# Patient Record
Sex: Male | Born: 1985 | Race: White | Hispanic: No | Marital: Single | Smoking: Never smoker
Health system: Southern US, Community
[De-identification: ages and names within clinical notes are randomized; demographics above are authoritative.]

## PROBLEM LIST (undated history)

## (undated) DIAGNOSIS — J45909 Unspecified asthma, uncomplicated: Secondary | ICD-10-CM

## (undated) HISTORY — PX: NO PAST SURGERIES: SHX2092

---
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
## 2005-01-05 ENCOUNTER — Emergency Department: Payer: Self-pay | Admitting: Emergency Medicine

## 2005-03-21 ENCOUNTER — Emergency Department: Payer: Self-pay | Admitting: Emergency Medicine

## 2006-05-18 ENCOUNTER — Emergency Department: Payer: Self-pay | Admitting: Emergency Medicine

## 2006-05-19 ENCOUNTER — Other Ambulatory Visit: Payer: Self-pay

## 2006-07-30 IMAGING — CR DG RIBS 2V*R*
1 series · 3 of 3 positions shown · non-contrast
Comparison: none

REASON FOR EXAM: RIGHT-sided pain. [HOSPITAL]
COMMENTS:

PROCEDURE:     DXR - DXR RIBS RIGHT UNILATERAL  - March 21, 2005  [DATE]
RESULT:     Three views of the RIGHT ribs show no fracture, dislocation, or
other acute bony abnormality.

[Series 1: view not recorded · 0.17mm/px · 3 of 3 slices shown]
[im 1/3]
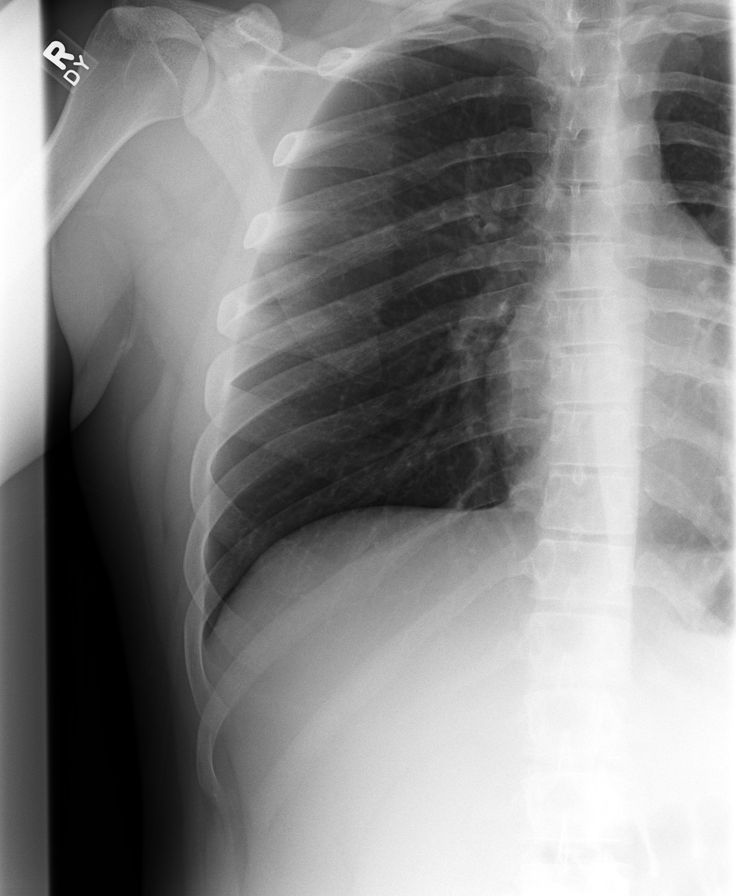
[im 2/3]
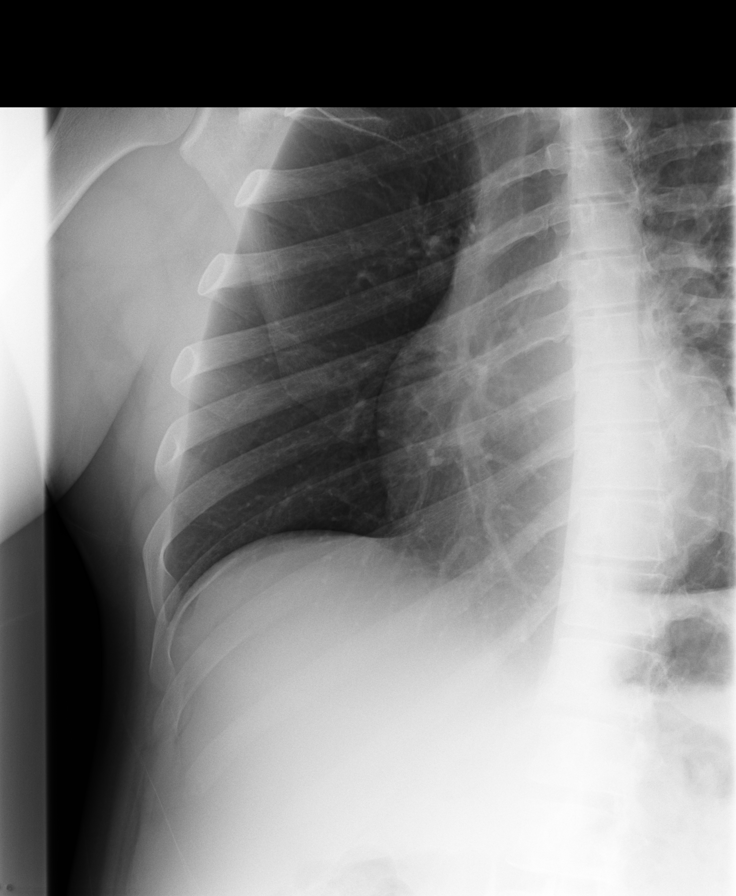
[im 3/3]
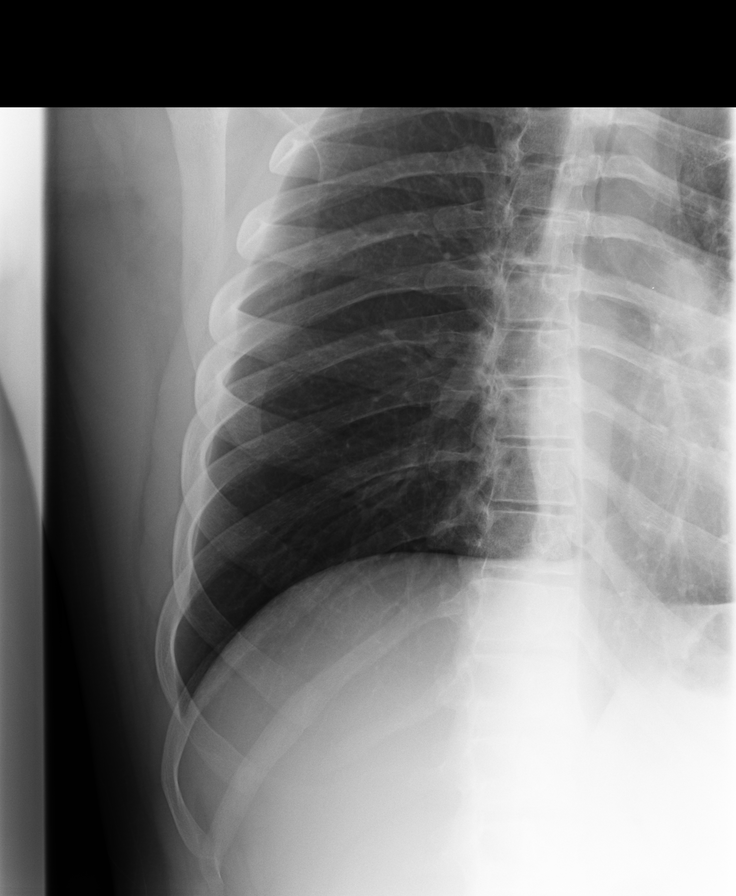

[3 of 3 positions shown; findings below may reference images not displayed]

IMPRESSION: No significant abnormalities are noted.

## 2007-09-16 ENCOUNTER — Emergency Department: Payer: Self-pay | Admitting: Emergency Medicine

## 2010-07-07 ENCOUNTER — Emergency Department: Payer: Self-pay | Admitting: Emergency Medicine

## 2010-07-09 ENCOUNTER — Emergency Department: Payer: Self-pay | Admitting: Internal Medicine

## 2014-02-06 ENCOUNTER — Emergency Department: Payer: Self-pay | Admitting: Emergency Medicine

## 2014-02-06 LAB — COMPREHENSIVE METABOLIC PANEL
Albumin: 4.7 g/dL (ref 3.4–5.0)
Alkaline Phosphatase: 127 U/L — ABNORMAL HIGH
Anion Gap: 15 (ref 7–16)
BUN: 16 mg/dL (ref 7–18)
Bilirubin,Total: 0.7 mg/dL (ref 0.2–1.0)
Calcium, Total: 9.4 mg/dL (ref 8.5–10.1)
Chloride: 106 mmol/L (ref 98–107)
Co2: 20 mmol/L — ABNORMAL LOW (ref 21–32)
Creatinine: 1.55 mg/dL — ABNORMAL HIGH (ref 0.60–1.30)
EGFR (African American): >60
EGFR (Non-African Amer.): >60
Glucose: 106 mg/dL — ABNORMAL HIGH (ref 65–99)
Osmolality: 283 (ref 275–301)
Potassium: 3.8 mmol/L (ref 3.5–5.1)
SGOT(AST): 35 U/L (ref 15–37)
SGPT (ALT): 23 U/L
Sodium: 141 mmol/L (ref 136–145)
Total Protein: 8.4 g/dL — ABNORMAL HIGH (ref 6.4–8.2)

## 2014-02-06 LAB — URINALYSIS, COMPLETE
Bilirubin,UR: NEGATIVE
Glucose,UR: NEGATIVE mg/dL (ref 0–75)
Leukocyte Esterase: NEGATIVE
Nitrite: NEGATIVE
Ph: 5 (ref 4.5–8.0)
Protein: 100
RBC,UR: 26 /HPF (ref 0–5)
Specific Gravity: 1.034 (ref 1.003–1.030)
Squamous Epithelial: NONE SEEN
WBC UR: NONE SEEN /HPF (ref 0–5)

## 2014-02-06 LAB — CBC WITH DIFFERENTIAL/PLATELET
Basophil #: 0.1 10*3/uL (ref 0.0–0.1)
Basophil %: 0.4 %
Eosinophil #: 0.1 10*3/uL (ref 0.0–0.7)
Eosinophil %: 0.5 %
HCT: 48.6 % (ref 40.0–52.0)
HGB: 16.5 g/dL (ref 13.0–18.0)
Lymphocyte #: 4.4 10*3/uL — ABNORMAL HIGH (ref 1.0–3.6)
Lymphocyte %: 18.4 %
MCH: 32.3 pg (ref 26.0–34.0)
MCHC: 34 g/dL (ref 32.0–36.0)
MCV: 95 fL (ref 80–100)
Monocyte #: 2.5 x10 3/mm — ABNORMAL HIGH (ref 0.2–1.0)
Monocyte %: 10.5 %
Neutrophil #: 16.9 10*3/uL — ABNORMAL HIGH (ref 1.4–6.5)
Neutrophil %: 70.2 %
Platelet: 314 10*3/uL (ref 150–440)
RBC: 5.11 10*6/uL (ref 4.40–5.90)
RDW: 12.7 % (ref 11.5–14.5)
WBC: 24.1 10*3/uL — ABNORMAL HIGH (ref 3.8–10.6)

## 2014-02-06 LAB — LIPASE, BLOOD: Lipase: 147 U/L (ref 73–393)

## 2015-01-31 ENCOUNTER — Encounter: Payer: Self-pay | Admitting: Emergency Medicine

## 2015-01-31 ENCOUNTER — Emergency Department
Admission: EM | Admit: 2015-01-31 | Discharge: 2015-01-31 | Disposition: A | Discharge status: Home / Self Care | Payer: Self-pay | Attending: Emergency Medicine | Admitting: Emergency Medicine

## 2015-01-31 DIAGNOSIS — R42 Dizziness and giddiness: Secondary | ICD-10-CM | POA: Insufficient documentation

## 2015-01-31 HISTORY — DX: Unspecified asthma, uncomplicated: J45.909

## 2015-01-31 MED ORDER — HYDROCHLOROTHIAZIDE 25 MG PO TABS: 25.0000 mg | ORAL_TABLET | Freq: Every day | ORAL | Status: AC

## 2015-01-31 MED ORDER — HYDROCHLOROTHIAZIDE 12.5 MG PO TABS: 25.0000 mg | ORAL_TABLET | Freq: Every day | ORAL | Status: DC

## 2015-01-31 NOTE — ED Notes (Signed)
Dizziness and room spinning for several days   Denies any specific pain but just feels full

## 2015-01-31 NOTE — Discharge Instructions (Signed)
Benign Positional Vertigo Vertigo means you feel like you or your surroundings are moving when they are not. Benign positional vertigo is the most common form of vertigo. Benign means that the cause of your condition is not serious. Benign positional vertigo is more common in older adults. CAUSES  Benign positional vertigo is the result of an upset in the labyrinth system. This is an area in the middle ear that helps control your balance. This may be caused by a viral infection, head injury, or repetitive motion. However, often no specific cause is found. SYMPTOMS  Symptoms of benign positional vertigo occur when you move your head or eyes in different directions. Some of the symptoms may include:  Loss of balance and falls.  Vomiting.  Blurred vision.  Dizziness.  Nausea.  Involuntary eye movements (nystagmus). DIAGNOSIS  Benign positional vertigo is usually diagnosed by physical exam. If the specific cause of your benign positional vertigo is unknown, your caregiver may perform imaging tests, such as magnetic resonance imaging (MRI) or computed tomography (CT). TREATMENT  Your caregiver may recommend movements or procedures to correct the benign positional vertigo. Medicines such as meclizine, benzodiazepines, and medicines for nausea may be used to treat your symptoms. In rare cases, if your symptoms are caused by certain conditions that affect the inner ear, you may need surgery. HOME CARE INSTRUCTIONS   Follow your caregiver's instructions.  Move slowly. Do not make sudden body or head movements.  Avoid driving.  Avoid operating heavy machinery.  Avoid performing any tasks that would be dangerous to you or others during a vertigo episode.  Drink enough fluids to keep your urine clear or pale yellow. SEEK IMMEDIATE MEDICAL CARE IF:   You develop problems with walking, weakness, numbness, or using your arms, hands, or legs.  You have difficulty speaking.  You develop  severe headaches.  Your nausea or vomiting continues or gets worse.  You develop visual changes.  Your family or friends notice any behavioral changes.  Your condition gets worse.  You have a fever.  You develop a stiff neck or sensitivity to light. MAKE SURE YOU:   Understand these instructions.  Will watch your condition.  Will get help right away if you are not doing well or get worse. Document Released: 03/05/2006 Document Revised: 08/20/2011 Document Reviewed: 02/15/2011 ExitCare Patient Information 2015 [employer]. This information is not intended to replace advice given to you by your health care provider. Make sure you discuss any questions you have with your health care provider.    

## 2015-01-31 NOTE — ED Provider Notes (Signed)
[facility] Emergency Department Provider Note  ____________________________________________  Time seen: On arrival  I have reviewed the triage vital signs and the nursing notes.   HISTORY  Chief Complaint Dizziness    HPI Henry Mosley. is a 29 y.o. male who complains of a sensation of room spinning. Started approximately one week ago when he was at the beach and has been improving since then. He denies head injury he denies fevers chills or neck pain. He did have a CT scan done at [facility] in [place] which was normal. He denies change in hearing. No other neuro deficits. No change in eyesight     Past Medical History  Diagnosis Date  . Asthma     There are no active problems to display for this patient.   History reviewed. No pertinent past surgical history.  Current Outpatient Rx  Name  Route  Sig  Dispense  Refill  . hydrochlorothiazide (HYDRODIURIL) 25 MG tablet   Oral   Take 1 tablet (25 mg total) by mouth daily.   30 tablet   0     Allergies Review of patient's allergies indicates no known allergies.  No family history on file.  Social History Social History  Substance Use Topics  . Smoking status: Never Smoker   . Smokeless tobacco: None  . Alcohol Use: No    Review of Systems  Constitutional: Negative for fever. Eyes: Negative for visual changes. ENT: Negative for sore throat Cardiovascular: Negative for chest pain. Respiratory: Negative for shortness of breath. Gastrointestinal: Negative for abdominal pain, vomiting and diarrhea. Genitourinary: Negative for dysuria. Musculoskeletal: Negative for back pain. Skin: Negative for rash. Neurological: Negative for headaches or focal weakness. Positive for dizziness/vertigo     ____________________________________________   PHYSICAL EXAM:  VITAL SIGNS: BP 165/80, HR 88  ED Triage Vitals  Enc Vitals Group     BP --      Pulse --      Resp --      Temp --       Temp src --      SpO2 --      Weight 01/31/15 1351 200 lb (90.719 kg)     Height 01/31/15 1351  (1.778 m)     Head Cir --      Peak Flow --      Pain Score 01/31/15 1351 5     Pain Loc --      Pain Edu? --      Excl. in GC? --      Constitutional: Alert and oriented. Well appearing and in no distress. Eyes: Conjunctivae are normal. PERRLA, EOMI ENT   Head: Normocephalic and atraumatic.   Mouth/Throat: Mucous membranes are moist.  Ears: Normal tympanic membrane bilaterally, normal canal Cardiovascular: Normal rate, regular rhythm. Normal and symmetric distal pulses are present in all extremities. No murmurs, rubs, or gallops. Respiratory: Normal respiratory effort without tachypnea nor retractions. Breath sounds are clear and equal bilaterally.  Gastrointestinal: Soft and non-tender in all quadrants. No distention. There is no CVA tenderness. Genitourinary: deferred Musculoskeletal: Nontender with normal range of motion in all extremities. No lower extremity tenderness nor edema. Neurologic:  Normal speech and language. No gross focal neurologic deficits are appreciated. Cranial nerves II through XII are intact Skin:  Skin is warm, dry and intact. No rash noted. Psychiatric: Mood and affect are normal. Patient exhibits appropriate insight and judgment.  ____________________________________________    LABS (pertinent positives/negatives)  Labs Reviewed -  No data to display  ____________________________________________   EKG  None  ____________________________________________    RADIOLOGY I have personally reviewed any xrays that were ordered on this patient: None  ____________________________________________   PROCEDURES  Procedure(s) performed: none  Critical Care performed: none  ____________________________________________   INITIAL IMPRESSION / ASSESSMENT AND PLAN / ED COURSE  Pertinent labs & imaging results that were available during  my care of the patient were reviewed by me and considered in my medical decision making (see chart for details).  Patient well-appearing and in no distress. He is neurologically intact. His symptoms are consistent with vertigo. I recommend meclizine 3 times a day and I think he will need follow-up with ENT for possible tilt table. Return precautions discussed with patient  ____________________________________________   FINAL CLINICAL IMPRESSION(S) / ED DIAGNOSES  Final diagnoses:  Vertigo     Jene Every, MD 01/31/15 [phone]

## 2015-01-31 NOTE — ED Notes (Signed)
Pt went to hospital in [place] last week, head CT was normal per pt.

## 2015-01-31 NOTE — ED Notes (Signed)
Pt states he was on vacation at the beach last week when his dizziness "room spinning" started. States his head feels "full."

## 2015-01-31 NOTE — ED Notes (Signed)
Pt discharged home after verbalizing understanding of discharge instructions; nad noted. 

## 2016-08-23 ENCOUNTER — Encounter: Payer: Self-pay | Admitting: Emergency Medicine

## 2016-08-23 ENCOUNTER — Emergency Department: Payer: Self-pay

## 2016-08-23 ENCOUNTER — Emergency Department
Admission: EM | Admit: 2016-08-23 | Discharge: 2016-08-23 | Disposition: A | Discharge status: Home / Self Care | Payer: Self-pay | Attending: Emergency Medicine | Admitting: Emergency Medicine

## 2016-08-23 ENCOUNTER — Other Ambulatory Visit: Payer: Self-pay

## 2016-08-23 DIAGNOSIS — J189 Pneumonia, unspecified organism: Secondary | ICD-10-CM

## 2016-08-23 DIAGNOSIS — J181 Lobar pneumonia, unspecified organism: Secondary | ICD-10-CM | POA: Insufficient documentation

## 2016-08-23 DIAGNOSIS — J45909 Unspecified asthma, uncomplicated: Secondary | ICD-10-CM | POA: Insufficient documentation

## 2016-08-23 LAB — CBC
HCT: 41.1 % (ref 40.0–52.0)
HEMOGLOBIN: 14.1 g/dL (ref 13.0–18.0)
MCH: 32 pg (ref 26.0–34.0)
MCHC: 34.4 g/dL (ref 32.0–36.0)
MCV: 93.1 fL (ref 80.0–100.0)
Platelets: 364 10*3/uL (ref 150–440)
RBC: 4.41 MIL/uL (ref 4.40–5.90)
RDW: 12.8 % (ref 11.5–14.5)
WBC: 23 10*3/uL — ABNORMAL HIGH (ref 3.8–10.6)

## 2016-08-23 LAB — BASIC METABOLIC PANEL
ANION GAP: 7 (ref 5–15)
BUN: 11 mg/dL (ref 6–20)
CHLORIDE: 107 mmol/L (ref 101–111)
CO2: 26 mmol/L (ref 22–32)
Calcium: 8.9 mg/dL (ref 8.9–10.3)
Creatinine, Ser: 0.83 mg/dL (ref 0.61–1.24)
GFR calc Af Amer: >60 mL/min (ref >60)
GFR calc non Af Amer: >60 mL/min (ref >60)
GLUCOSE: 102 mg/dL — AB (ref 65–99)
POTASSIUM: 3.2 mmol/L — AB (ref 3.5–5.1)
Sodium: 140 mmol/L (ref 135–145)

## 2016-08-23 LAB — TROPONIN I

## 2016-08-23 MED ORDER — NAPROXEN 500 MG PO TABS: 500.0000 mg | ORAL_TABLET | Freq: Two times a day (BID) | ORAL | 0 refills | Status: AC

## 2016-08-23 MED ORDER — AZITHROMYCIN 250 MG PO TABS: ORAL_TABLET | ORAL | 0 refills | Status: AC

## 2016-08-23 MED ORDER — CEFTRIAXONE SODIUM-DEXTROSE 1-3.74 GM-% IV SOLR
INTRAVENOUS | Status: AC
  Administered 2016-08-23: 1000 mg
  Filled 2016-08-23: qty 50

## 2016-08-23 MED ORDER — DEXTROSE 5 % IV SOLN
1.0000 g | Freq: Once | INTRAVENOUS | Status: AC
  Administered 2016-08-23: 1 g via INTRAVENOUS
  Filled 2016-08-23: qty 10

## 2016-08-23 MED ORDER — AZITHROMYCIN 500 MG PO TABS
500.0000 mg | ORAL_TABLET | Freq: Once | ORAL | Status: AC
  Administered 2016-08-23: 500 mg via ORAL
  Filled 2016-08-23: qty 1

## 2016-08-23 MED ORDER — SODIUM CHLORIDE 0.9 % IV BOLUS (SEPSIS)
1000.0000 mL | Freq: Once | INTRAVENOUS | Status: AC
  Administered 2016-08-23: 1000 mL via INTRAVENOUS

## 2016-08-23 NOTE — ED Triage Notes (Signed)
Says has been sick with resp illness since Sunday.  Says last night about 2 am started having chest pain mid chest after he coughed.  That pain is gone now, but he did have some tingling in arms earlier and called ems.

## 2016-08-23 NOTE — ED Provider Notes (Signed)
[facility] Emergency Department Provider Note  ____________________________________________  Time seen: Approximately 1:12 PM  I have reviewed the triage vital signs and the nursing notes.   HISTORY  Chief Complaint Chest Pain    HPI Henry Spelled G Laye Jr. is a 31 y.o. male who complains of gradual onset shortness of breath and occasional sharp pleuritic chest pain on the left side over the past 5 days. The pain is intermittent. He's had some productive cough as well. Pain is currently resolved. Nonradiating, moderate intensity when present. Worsened by deep breath or forceful coughing. He reports having a fever and chills and it feels just like when he had pneumonia as a child.  Denies any recent travel, hospitalizations or surgeries. No history of DVT or PE.     Past Medical History:  Diagnosis Date  . Asthma      There are no active problems to display for this patient.    History reviewed. No pertinent surgical history.   Prior to Admission medications   Medication Sig Start Date End Date Taking? Authorizing Provider  azithromycin (ZITHROMAX Z-PAK) 250 MG tablet Take 2 tablets (500 mg) on  Day 1,  followed by 1 tablet (250 mg) once daily on Days 2 through 5. 08/23/16   Sharman CheekPhillip Liev Brockbank, MD  hydrochlorothiazide (HYDRODIURIL) 25 MG tablet Take 1 tablet (25 mg total) by mouth daily. Patient not taking: Reported on 08/23/2016 01/31/15   Jene Everyobert Kinner, MD  naproxen (NAPROSYN) 500 MG tablet Take 1 tablet (500 mg total) by mouth 2 (two) times daily with a meal. 08/23/16   Sharman CheekPhillip Annarose Ouellet, MD     Allergies Patient has no known allergies.   No family history on file.  Social History Social History  Substance Use Topics  . Smoking status: Never Smoker  . Smokeless tobacco: Never Used  . Alcohol use No    Review of Systems  Constitutional:   Positive fever and chills.  ENT:   No sore throat. No rhinorrhea. Cardiovascular:   That of intermittent  pleuritic chest pain. Respiratory:   Positive shortness of breath with productive cough. Gastrointestinal:   Negative for abdominal pain, vomiting and diarrhea.  Genitourinary:   Negative for dysuria or difficulty urinating. Musculoskeletal:   Negative for focal pain or swelling Neurological:   Negative for headaches 10-point ROS otherwise negative.  ____________________________________________   PHYSICAL EXAM:  VITAL SIGNS: ED Triage Vitals [08/23/16 1117]  Enc Vitals Group     BP (!) 162/98     Pulse Rate (!) 102     Resp 16     Temp 99.2 F (37.3 C)     Temp Source Oral     SpO2 99 %     Weight 200 lb (90.7 kg)     Height 5\' 10"  (1.778 m)     Head Circumference      Peak Flow      Pain Score      Pain Loc      Pain Edu?      Excl. in GC?     Vital signs reviewed, nursing assessments reviewed.   Constitutional:   Alert and oriented. Well appearing and in no distress. Eyes:   No scleral icterus. No conjunctival pallor. PERRL. EOMI.  No nystagmus. ENT   Head:   Normocephalic and atraumatic.   Nose:   No congestion/rhinnorhea. No septal hematoma   Mouth/Throat:   MMM, no pharyngeal erythema. No peritonsillar mass.    Neck:   No stridor.  No SubQ emphysema. No meningismus. Hematological/Lymphatic/Immunilogical:   No cervical lymphadenopathy. Cardiovascular:   Tachycardia heart rate 105. Symmetric bilateral radial and DP pulses.  No murmurs.  Respiratory:   Normal respiratory effort without tachypnea nor retractions. Breath sounds are clear and equal bilaterally. No wheezes/rales/rhonchi. Gastrointestinal:   Soft and nontender. Non distended. There is no CVA tenderness.  No rebound, rigidity, or guarding. Genitourinary:   deferred Musculoskeletal:   Normal range of motion in all extremities. No joint effusions.  No lower extremity tenderness.  No edema. Neurologic:   Normal speech and language.  CN 2-10 normal. Motor grossly intact. No gross focal  neurologic deficits are appreciated.  Skin:    Skin is warm, dry and intact. No rash noted.  No petechiae, purpura, or bullae.  ____________________________________________    LABS (pertinent positives/negatives) (all labs ordered are listed, but only abnormal results are displayed) Labs Reviewed  BASIC METABOLIC PANEL - Abnormal; Notable for the following:       Result Value   Potassium 3.2 (*)    Glucose, Bld 102 (*)    All other components within normal limits  CBC - Abnormal; Notable for the following:    WBC 23.0 (*)    All other components within normal limits  TROPONIN I   ____________________________________________   EKG  Interpreted by me  Date: 08/23/2016  Rate: 98  Rhythm: normal sinus rhythm  QRS Axis: normal  Intervals: normal  ST/T Wave abnormalities: normal  Conduction Disutrbances: none  Narrative Interpretation: unremarkable      ____________________________________________    RADIOLOGY  Dg Chest 2 View  Result Date: 08/23/2016 CLINICAL DATA:  Or days of respiratory illness in with chest pain and cough. History of childhood asthma. Nonsmoker. EXAM: CHEST  2 VIEW COMPARISON:  Chest x-ray of May 19, 2006. FINDINGS: There is dense infiltrate in the superior segment of the left lower lobe. The right lung is clear. There is no pleural effusion or pneumothorax. The heart and pulmonary vascularity are normal. The bony thorax is unremarkable. IMPRESSION: Dense infiltrate compatible with pneumonia in the superior segment of the left lower lobe. Follow-up radiographs following anticipated antibiotic therapy are recommended unless the patient's symptoms completely resolve. Electronically Signed   By: David  [place] M.D.   On: 08/23/2016 11:38  Image daily me. The area of consolidation is not consistent with Hampton's hump.  ____________________________________________   PROCEDURES Procedures  ____________________________________________   INITIAL  IMPRESSION / ASSESSMENT AND PLAN / ED COURSE  Pertinent labs & imaging results that were available during my care of the patient were reviewed by me and considered in my medical decision making (see chart for details).  Patient well appearing no acute distress, presents with pleuritic chest pain shortness of breath and productive cough. Also history of fever. Examination reveals tachycardia but otherwise unremarkable. Workup includes leukocytosis and consolidation of partial left lower lobe on x-ray consistent with pneumonia. Presentation is not consistent with ACS or PE or dissection. We'll start the patient on IV fluids and antibiotics here in the ED to ensure hydration, prescribe antibiotics and follow up with primary care. Return precautions given.         ____________________________________________   FINAL CLINICAL IMPRESSION(S) / ED DIAGNOSES  Final diagnoses:  Community acquired pneumonia of left lower lobe of lung (HCC)      New Prescriptions   AZITHROMYCIN (ZITHROMAX Z-PAK) 250 MG TABLET    Take 2 tablets (500 mg) on  Day 1,  followed by 1 tablet (250  mg) once daily on Days 2 through 5.   NAPROXEN (NAPROSYN) 500 MG TABLET    Take 1 tablet (500 mg total) by mouth 2 (two) times daily with a meal.     Portions of this note were generated with dragon dictation software. Dictation errors may occur despite best attempts at proofreading.    Sharman Cheek, MD 08/23/16 1318

## 2016-12-18 ENCOUNTER — Encounter: Payer: Self-pay | Admitting: Emergency Medicine

## 2016-12-18 ENCOUNTER — Emergency Department
Admission: EM | Admit: 2016-12-18 | Discharge: 2016-12-18 | Disposition: A | Discharge status: Home / Self Care | Payer: Self-pay | Attending: Student in an Organized Health Care Education/Training Program | Admitting: Student in an Organized Health Care Education/Training Program

## 2016-12-18 DIAGNOSIS — Y939 Activity, unspecified: Secondary | ICD-10-CM | POA: Insufficient documentation

## 2016-12-18 DIAGNOSIS — Z79899 Other long term (current) drug therapy: Secondary | ICD-10-CM | POA: Insufficient documentation

## 2016-12-18 DIAGNOSIS — Y929 Unspecified place or not applicable: Secondary | ICD-10-CM | POA: Insufficient documentation

## 2016-12-18 DIAGNOSIS — Y999 Unspecified external cause status: Secondary | ICD-10-CM | POA: Insufficient documentation

## 2016-12-18 DIAGNOSIS — W450XXA Nail entering through skin, initial encounter: Secondary | ICD-10-CM | POA: Insufficient documentation

## 2016-12-18 DIAGNOSIS — J45909 Unspecified asthma, uncomplicated: Secondary | ICD-10-CM | POA: Insufficient documentation

## 2016-12-18 DIAGNOSIS — Z23 Encounter for immunization: Secondary | ICD-10-CM | POA: Insufficient documentation

## 2016-12-18 DIAGNOSIS — S91332A Puncture wound without foreign body, left foot, initial encounter: Secondary | ICD-10-CM | POA: Insufficient documentation

## 2016-12-18 MED ORDER — TETANUS-DIPHTH-ACELL PERTUSSIS 5-2.5-18.5 LF-MCG/0.5 IM SUSP
0.5000 mL | Freq: Once | INTRAMUSCULAR | Status: AC
Start: 2016-12-18 — End: 2016-12-18
  Administered 2016-12-18: 0.5 mL via INTRAMUSCULAR
  Filled 2016-12-18: qty 0.5

## 2016-12-18 MED ORDER — CEPHALEXIN 500 MG PO CAPS: 500.0000 mg | ORAL_CAPSULE | Freq: Three times a day (TID) | ORAL | 0 refills | Status: AC

## 2016-12-18 NOTE — ED Provider Notes (Signed)
[facility] Emergency Department Provider Note  ____________________________________________  Time seen: Approximately 8:13 PM  I have reviewed the triage vital signs and the nursing notes.   HISTORY  Chief Complaint Laceration    HPI Henry Mosley. is a 31 y.o. male presents to the emergency department after stepping on a nail. Patient sustained a puncture wound to left foot. Wound was diffusely irrigated with hydrogen peroxide. Patient states that his tetanus status needs updating. Patient denies radiculopathy, weakness and changes in sensation of the left lower extremity.   Past Medical History:  Diagnosis Date  . Asthma     There are no active problems to display for this patient.   History reviewed. No pertinent surgical history.  Prior to Admission medications   Medication Sig Start Date End Date Taking? Authorizing Provider  azithromycin (ZITHROMAX Z-PAK) 250 MG tablet Take 2 tablets (500 mg) on  Day 1,  followed by 1 tablet (250 mg) once daily on Days 2 through 5. 08/23/16   Sharman Cheek, MD  cephALEXin (KEFLEX) 500 MG capsule Take 1 capsule (500 mg total) by mouth 3 (three) times daily. 12/18/16 12/28/16  Orvil Feil, PA-C  hydrochlorothiazide (HYDRODIURIL) 25 MG tablet Take 1 tablet (25 mg total) by mouth daily. Patient not taking: Reported on 08/23/2016 01/31/15   Jene Every, MD  naproxen (NAPROSYN) 500 MG tablet Take 1 tablet (500 mg total) by mouth 2 (two) times daily with a meal. 08/23/16   Sharman Cheek, MD    Allergies Patient has no known allergies.  No family history on file.  Social History Social History  Substance Use Topics  . Smoking status: Never Smoker  . Smokeless tobacco: Never Used  . Alcohol use No     Review of Systems  Constitutional: No fever/chills Eyes: No visual changes. No discharge ENT: No upper respiratory complaints. Cardiovascular: no chest pain. Respiratory: no cough. No  SOB. Gastrointestinal: No abdominal pain.  No nausea, no vomiting.  No diarrhea.  No constipation. Genitourinary: Negative for dysuria. No hematuria Musculoskeletal: Negative for musculoskeletal pain. Skin: Patient has puncture wound of left foot.  Neurological: Negative for headaches, focal weakness or numbness.   ____________________________________________   PHYSICAL EXAM:  VITAL SIGNS: ED Triage Vitals  Enc Vitals Group     BP 12/18/16 1922 (!) 158/95     Pulse Rate 12/18/16 1922 (!) 110     Resp 12/18/16 1922 20     Temp 12/18/16 1922 98.1 F (36.7 C)     Temp Source 12/18/16 1922 Oral     SpO2 12/18/16 1922 97 %     Weight 12/18/16 1923 215 lb (97.5 kg)     Height 12/18/16 1923 5\' 10"  (1.778 m)     Head Circumference --      Peak Flow --      Pain Score --      Pain Loc --      Pain Edu? --      Excl. in GC? --      Constitutional: Alert and oriented. Well appearing and in no acute distress. Eyes: Conjunctivae are normal. PERRL. EOMI. Head: Atraumatic. Cardiovascular: Normal rate, regular rhythm. Normal S1 and S2.  Good peripheral circulation. Respiratory: Normal respiratory effort without tachypnea or retractions. Lungs CTAB. Good air entry to the bases with no decreased or absent breath sounds. Musculoskeletal: Patient has 5 out of 5 strength of the lower extremities bilaterally. Patient is able to perform dorsiflexion and plantar flexion at  the left foot. Patient can move all 5 left toes. Palpable dorsalis pedis pulse bilaterally and symmetrically. Neurologic:  Normal speech and language. No gross focal neurologic deficits are appreciated.  Skin: Patient has 0.25 cm puncture wound at the dorsum of left foot without surrounding cellulitis. Psychiatric: Mood and affect are normal. Speech and behavior are normal. Patient exhibits appropriate insight and judgement.   ____________________________________________   LABS (all labs ordered are listed, but only  abnormal results are displayed)  Labs Reviewed - No data to display ____________________________________________  EKG   ____________________________________________  RADIOLOGY   No results found.  ____________________________________________    PROCEDURES  Procedure(s) performed:    Procedures    Medications  Tdap (BOOSTRIX) injection 0.5 mL (0.5 mLs Intramuscular Given 12/18/16 2012)     ____________________________________________   INITIAL IMPRESSION / ASSESSMENT AND PLAN / ED COURSE  Pertinent labs & imaging results that were available during my care of the patient were reviewed by me and considered in my medical decision making (see chart for details).  Review of the  [place]RS was performed in accordance of the [place] prior to dispensing any controlled drugs.    Assessment and plan: Puncture Wound:  Patient presents to the emergency department with a puncture wound of the left foot. Patient's tetanus status was updated in the emergency department and he was discharged with Keflex. Patient was advised to follow-up with primary care as needed. All patient questions were answered. ____________________________________________  FINAL CLINICAL IMPRESSION(S) / ED DIAGNOSES  Final diagnoses:  Puncture wound of left foot, initial encounter      NEW MEDICATIONS STARTED DURING THIS VISIT:  New Prescriptions   CEPHALEXIN (KEFLEX) 500 MG CAPSULE    Take 1 capsule (500 mg total) by mouth 3 (three) times daily.        This chart was dictated using voice recognition software/Dragon. Despite best efforts to proofread, errors can occur which can change the meaning. Any change was purely unintentional.'   Orvil FeilWoods, Rohini Jaroszewski M, PA-C 12/18/16 2019    Willy Eddyobinson, Patrick, MD 12/18/16 2055

## 2016-12-18 NOTE — ED Triage Notes (Signed)
Patient reports stepping on nail with left foot this morning. States he cleaned wound well and bleeding is controlled. Patient requesting tetanus booster stating his last one was approximately 14 years ago. NAD noted at this time.

## 2018-03-04 ENCOUNTER — Other Ambulatory Visit: Payer: Self-pay

## 2018-03-04 ENCOUNTER — Ambulatory Visit (INDEPENDENT_AMBULATORY_CARE_PROVIDER_SITE_OTHER): Payer: Self-pay

## 2018-03-04 ENCOUNTER — Ambulatory Visit
Admission: EM | Admit: 2018-03-04 | Discharge: 2018-03-04 | Disposition: A | Discharge status: Home / Self Care | Payer: Self-pay | Attending: Family Medicine | Admitting: Family Medicine

## 2018-03-04 DIAGNOSIS — W19XXXA Unspecified fall, initial encounter: Secondary | ICD-10-CM

## 2018-03-04 DIAGNOSIS — S83411A Sprain of medial collateral ligament of right knee, initial encounter: Secondary | ICD-10-CM

## 2018-03-04 NOTE — ED Provider Notes (Signed)
[facility]    CSN: [id number] Arrival date & time: 03/04/18  1659     History   Chief Complaint Chief Complaint  Patient presents with  . Knee Pain    right    HPI Lawrence Mitch. is a 32 y.o. male.   32 yo male with a c/o right knee pain and swelling since injuring it on Sunday. States he fell and twisted his knee.   The history is provided by the patient.    Past Medical History:  Diagnosis Date  . Asthma     There are no active problems to display for this patient.   Past Surgical History:  Procedure Laterality Date  . NO PAST SURGERIES         Home Medications    Prior to Admission medications   Medication Sig Start Date End Date Taking? Authorizing Provider  azithromycin (ZITHROMAX Z-PAK) 250 MG tablet Take 2 tablets (500 mg) on  Day 1,  followed by 1 tablet (250 mg) once daily on Days 2 through 5. 08/23/16   Sharman Cheek, MD  hydrochlorothiazide (HYDRODIURIL) 25 MG tablet Take 1 tablet (25 mg total) by mouth daily. Patient not taking: Reported on 08/23/2016 01/31/15   Jene Every, MD  naproxen (NAPROSYN) 500 MG tablet Take 1 tablet (500 mg total) by mouth 2 (two) times daily with a meal. 08/23/16   Sharman Cheek, MD    Family History Family History  Problem Relation Age of Onset  . CVA Father     Social History Social History   Tobacco Use  . Smoking status: Never Smoker  . Smokeless tobacco: Never Used  Substance Use Topics  . Alcohol use: Yes    Comment: socially  . Drug use: Yes    Types: Marijuana     Allergies   Patient has no known allergies.   Review of Systems Review of Systems   Physical Exam Triage Vital Signs ED Triage Vitals  Enc Vitals Group     BP 03/04/18 1715 (!) 151/91     Pulse Rate 03/04/18 1715 89     Resp 03/04/18 1715 18     Temp 03/04/18 1715 98.8 F (37.1 C)     Temp Source 03/04/18 1715 Oral     SpO2 03/04/18 1715 96 %     Weight 03/04/18 1713 215 lb (97.5 kg)     Height  03/04/18 1713 5\' 10"  (1.778 m)     Head Circumference --      Peak Flow --      Pain Score 03/04/18 1712 8     Pain Loc --      Pain Edu? --      Excl. in GC? --    No data found.  Updated Vital Signs BP (!) 151/91 (BP Location: Left Arm)   Pulse 89   Temp 98.8 F (37.1 C) (Oral)   Resp 18   Ht 5\' 10"  (1.778 m)   Wt 97.5 kg   SpO2 96%   BMI 30.85 kg/m   Visual Acuity Right Eye Distance:   Left Eye Distance:   Bilateral Distance:    Right Eye Near:   Left Eye Near:    Bilateral Near:     Physical Exam  Constitutional: He appears well-developed and well-nourished. No distress.  Musculoskeletal:       Right knee: He exhibits swelling. He exhibits normal range of motion, no effusion, no ecchymosis, no deformity, no laceration, no erythema, normal  alignment, no LCL laxity, normal patellar mobility, normal meniscus and no MCL laxity. Tenderness found. Medial joint line and MCL tenderness noted. No lateral joint line, no LCL and no patellar tendon tenderness noted.  Skin: He is not diaphoretic.  Nursing note and vitals reviewed.    UC Treatments / Results  Labs (all labs ordered are listed, but only abnormal results are displayed) Labs Reviewed - No data to display  EKG None  Radiology Dg Knee Complete 4 Views Right  Result Date: 03/04/2018 CLINICAL DATA:  Twisting injury to right knee 3 days ago. Continued medial pain. EXAM: RIGHT KNEE - COMPLETE 4+ VIEW COMPARISON:  None. FINDINGS: No evidence of fracture, dislocation, or joint effusion. No evidence of arthropathy or other focal bone abnormality. Soft tissues are unremarkable. IMPRESSION: Negative. Electronically Signed   By: Charlett NoseKevin  Dover M.D.   On: 03/04/2018 17:59    Procedures Procedures (including critical care time)  Medications Ordered in UC Medications - No data to display  Initial Impression / Assessment and Plan / UC Course  I have reviewed the triage vital signs and the nursing notes.  Pertinent  labs & imaging results that were available during my care of the patient were reviewed by me and considered in my medical decision making (see chart for details).      Final Clinical Impressions(s) / UC Diagnoses   Final diagnoses:  Sprain of medial collateral ligament of right knee, initial encounter    ED Prescriptions    None     1. x-ray results and diagnosis reviewed with patient 2. rx as per orders above; reviewed possible side effects, interactions, risks and benefits  3. Recommend supportive treatment with rest, ice, elevation, otc analgesics prn 4. Knee brace  5. Follow-up prn if symptoms worsen or don't improve   Controlled Substance Prescriptions [place] Controlled Substance Registry consulted? Not Applicable   Payton Mccallumonty, Lorelei Heikkila, MD 03/04/18 2006

## 2018-03-04 NOTE — ED Triage Notes (Signed)
Patient complains of right knee pain that occurred on Sunday. Patient states that he twisted the area and states that the swelling has been constant. Patient reports that it feels as if his knee cap is going to pop off.
